# Patient Record
Sex: Female | Born: 1985 | Race: White | Hispanic: Yes | Marital: Married | Smoking: Never smoker
Health system: Southern US, Community
[De-identification: ages and names within clinical notes are randomized; demographics above are authoritative.]

## PROBLEM LIST (undated history)

## (undated) DIAGNOSIS — R7989 Other specified abnormal findings of blood chemistry: Secondary | ICD-10-CM

## (undated) DIAGNOSIS — I8393 Asymptomatic varicose veins of bilateral lower extremities: Secondary | ICD-10-CM

## (undated) DIAGNOSIS — F410 Panic disorder [episodic paroxysmal anxiety] without agoraphobia: Secondary | ICD-10-CM

## (undated) HISTORY — DX: Other specified abnormal findings of blood chemistry: R79.89

## (undated) HISTORY — DX: Panic disorder (episodic paroxysmal anxiety): F41.0

## (undated) HISTORY — DX: Asymptomatic varicose veins of bilateral lower extremities: I83.93

---
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
## 2004-06-01 ENCOUNTER — Inpatient Hospital Stay (HOSPITAL_COMMUNITY): Admission: AD | Admit: 2004-06-01 | Discharge: 2004-06-03 | Payer: Self-pay | Admitting: Obstetrics and Gynecology

## 2004-06-01 ENCOUNTER — Ambulatory Visit: Payer: Self-pay | Admitting: Obstetrics and Gynecology

## 2009-08-18 ENCOUNTER — Ambulatory Visit (HOSPITAL_COMMUNITY): Admission: RE | Admit: 2009-08-18 | Discharge: 2009-08-18 | Payer: Self-pay | Admitting: Obstetrics & Gynecology

## 2010-01-03 ENCOUNTER — Ambulatory Visit: Payer: Self-pay | Admitting: Obstetrics and Gynecology

## 2010-01-03 ENCOUNTER — Inpatient Hospital Stay (HOSPITAL_COMMUNITY): Admission: AD | Admit: 2010-01-03 | Discharge: 2010-01-05 | Payer: Self-pay | Admitting: Obstetrics & Gynecology

## 2010-09-25 LAB — RPR: RPR Ser Ql: NONREACTIVE

## 2010-09-25 LAB — CBC
HCT: 39.7 % (ref 36.0–46.0)
Hemoglobin: 13.6 g/dL (ref 12.0–15.0)
MCH: 32.7 pg (ref 26.0–34.0)
MCHC: 34.1 g/dL (ref 30.0–36.0)
MCV: 95.8 fL (ref 78.0–100.0)
Platelets: 195 10*3/uL (ref 150–400)
RBC: 4.14 MIL/uL (ref 3.87–5.11)
RDW: 14.9 % (ref 11.5–15.5)
WBC: 10.9 10*3/uL — ABNORMAL HIGH (ref 4.0–10.5)

## 2012-05-05 ENCOUNTER — Ambulatory Visit: Payer: Self-pay | Admitting: Internal Medicine

## 2012-05-05 VITALS — BP 118/66 | HR 93 | Temp 98.2°F | Resp 18 | Ht 63.0 in | Wt 132.2 lb

## 2012-05-05 DIAGNOSIS — F439 Reaction to severe stress, unspecified: Secondary | ICD-10-CM

## 2012-05-05 DIAGNOSIS — G43709 Chronic migraine without aura, not intractable, without status migrainosus: Secondary | ICD-10-CM

## 2012-05-05 DIAGNOSIS — Z609 Problem related to social environment, unspecified: Secondary | ICD-10-CM

## 2012-05-05 DIAGNOSIS — Z789 Other specified health status: Secondary | ICD-10-CM

## 2012-05-05 DIAGNOSIS — F430 Acute stress reaction: Secondary | ICD-10-CM

## 2012-05-05 DIAGNOSIS — G43909 Migraine, unspecified, not intractable, without status migrainosus: Secondary | ICD-10-CM

## 2012-05-05 MED ORDER — IBUPROFEN 600 MG PO TABS: 600.0000 mg | ORAL_TABLET | Freq: Three times a day (TID) | ORAL | Status: DC | PRN

## 2012-05-05 MED ORDER — ALPRAZOLAM 0.25 MG PO TABS: 0.2500 mg | ORAL_TABLET | Freq: Two times a day (BID) | ORAL | Status: DC | PRN

## 2012-05-05 NOTE — Patient Instructions (Addendum)
Kara Pacer por estrs (Stress Fracture) Cuando se aplica demasiada tensin sobre el pie, como en los deportes en los que se corre o se salta, el eje central de los huesos del pie es muy susceptible de sufrir una fractura (quebradura del [place]) debido a lo delgado del [place]. La lesin es ms comn cuando hay osteoporosis o si se utiliza un calzado inadecuado para correr. Deben utilizarse zapatos con un colchn de aire adecuado para que absorba lo golpes de la actividad que se Biomedical engineer. Las fracturas por estrs son muy comunes en las competiciones de atletismo en mujeres, quienes desarrollan pequeas roturas en la superficie de los huesos en sus pies y piernas. Las mujeres ms propensas a sufrir estas lesiones son aquellas que restringen ciertas comidas y las que tienen periodos irregulares. Las fracturas por estrs normalmente comienzan como una molestia menor en el pie o pierna. La fractura normalmente sucede cerca del final de una carrera larga. Por lo general el dolor desaparece con el reposo. Al da siguiente el dolor reaparece al comenzar la carrera. Si el deportista nota que el dolor aparece al tocar un punto del hueso y deja de correr por Elisabeth Most, podr volver a la carrera rpidamente. Pero normalmente el dolor se ignora y se desarrolla una fractura por estrs. El deportista deber evitar el impacto fuerte de correr, pero puede andar en bicicleta o nadar hasta que la fractura se cure luego de 6 a 12 semanas. Los lugares ms comunes para la fractura por estrs son los huesos de la regin anterior del pie y el hueso ms largo de la parte inferior de la pierna, pero la actividad de correr puede producir fracturas en cualquier parte, incluso en los huesos plvicos. DIAGNSTICO Generalmente el diagnstico se efecta en base a la historia clnica. El hueso implicado comienza a doler progresivamente como consecuencia de la [place]. Las radiografas pueden dar un resultado negativo (no Scientist, clinical (histocompatibility and immunogenetics) fracturas) en  las primeras dos o tres semanas luego del comienzo del Engineer, mining. Una radiografa posterior podr mostrar seales de curacin del hueso (formacin callosa). Una gammagrafa sea generalmente establecer el diagnstico. INSTRUCCIONES PARA EL CUIDADO DOMICILIARIO   El tratamiento podr incluir o no un yeso o una bota. Cuando es Programmer, applications un yeso, generalmente se [place] por un perodo breve para no hacer ms lenta la curacin por la prdida del msculo (atrofia).   Debe suspender las actividades hasta que el profesional que lo asiste se lo indique.   Use zapatos que permitan absorber los impactos.   Podr Charity fundraiser espera que el hueso se cure. Entre ellos se incluyen el ciclismo y la natacin, o aquellos que el profesional le aconseje.  Si no le han colocado un yeso o una tablilla:  Podr caminar con el pie lesionado segn lo tolere o como se lo hayan aconsejado.   No soporte ningn peso sobre el pie lesionado hasta que se le indique. Aumente lentamente la cantidad de tiempo que camina sobre el pie, hasta que el dolor se lo permita, o segn se lo hayan indicado.   Use muletas hasta que pueda soportar el peso sin dolor. Un aumento gradual del [address]   Aplique hielo sobre la lesin durante 15 a 20 minutos por hora mientras se encuentre despierto, durante los 2 primeros [place]. Ponga el hielo en una bolsa plstica y coloque una toalla entre la bolsa y la piel.   Utilice los medicamentos de venta libre o de prescripcin para Chief Technology Officer, Environmental health practitioner o  la fiebre, segn se lo indique el profesional que lo asiste.   Si el mdico le ha recomendado una evaluacin de seguimiento, es importante que concurra a la cita. Si no cumple con el seguimiento podr resultar en una lesin crnica o permanente, dolor e incapacidad. Si existe algn problema que le impide concurrir a la cita, Multimedia programmer a las instalaciones para obtener asistencia.  SOLICITE ATENCIN MDICA DE  INMEDIATO SI:  El dolor empeora en lugar de mejorar, o si el dolor no es controlable con los medicamentos.   Aumenta la hinchazn o el enrojecimiento en el pie.  Document Released: 06/08/2008 Document Revised: 06/15/2011 [facility] Patient Information 2012 [place], [place].Cefalea migraosa (Migraine Headache)  Caroleen Hamman es un dolor intenso y punzante en uno o ambos lados de la cabeza. Puede durar desde 30 minutos hasta varias horas.  CAUSAS  No siempre se conoce la causa exacta. Sin embargo, IT consultant [employer] nervios del cerebro se irritan y liberan ciertas sustancias qumicas que causan inflamacin. Esto ocasiona dolor.  SNTOMAS   Dolor en uno o ambos lados de la cabeza.  Dolor punzante o con pulsaciones.  Dolor que es lo suficientemente grave en intensidad como para impedir las actividades habituales.  Se agrava por cualquier actividad fsica habitual.  Nuseas, vmitos o ambos.  Mareos.  Dolor ante la exposicin a luces brillantes o a ruidos fuertes o con Agricultural engineer.  Sensibilidad general a las luces brillantes, a los ruidos fuertes o a los [employer]. Antes de sentir a Caroleen Hamman, puede recibir seales de advertencia de que est por aparecer (aura). Un aura puede incluir:   Visin de luces intermitentes.  Visin de puntos brillantes, halos o lneas en zigzag.  Visin en tnel o visin borrosa.  Sensacin de entumecimiento u hormigueo.  Tener dificultad para hablar.  Tener debilidad muscular. DISPARADORES DE LA CEFALEA MIGRAOSA  Beber alcohol.  El hbito de fumar.  El estrs.  Con la menstruacin.  Quesos estacionados.  Alimentos o bebidas que contienen nitratos, glutamato, aspartamo o tiramina.  La falta de sueo.  Chocolate.  Cafena.  Hambre.  Con una actividad fsica extenuante.  Fatiga.  Los medicamentos utilizados para tratar Aeronautical engineer (nitroglicerina), las pldoras anticonceptivas, los estrgenos y algunos medicamentos  para la hipertensin pueden provocarla. DIAGNSTICO Una cefalea migraosa a menudo se diagnostica segn:  Sntomas.  Examen fsico.  Neomia Dear TC (tomografa computada) o resonancia magntica de la cabeza. TRATAMIENTO Le prescribirn medicamentos para Engineer, materials y las nuseas. Tambin podrn administrarse medicamentos para ayudar a Armed forces training and education officer.  INSTRUCCIONES PARA EL CUIDADO EN EL HOGAR   Slo tome medicamentos de venta libre o recetados para Primary school teacher o Environmental health practitioner, segn las indicaciones de su mdico. No se recomienda usar analgsicos narcticos a Air cabin crew.  Acustese en un cuarto oscuro y tranquilo cuando tiene [place].  Lleve un registro diario para Financial risk analyst lo que puede provocar dolores de cabeza por la [place]. Por ejemplo, escriba:  Lo que come y bebe.  Cunto tiempo duerme.  Todo cambio en la dieta o medicamentos.  Limite el consumo de bebidas alcohlicas.  Si fuma, deje de hacerlo.  Duerma entre 7 y 9 horas o como lo indique su mdico.  Limite las situaciones de Librarian, academic.  Mantenga las luces tenues si le [employer] luces brillantes y la [place]. SOLICITE ATENCIN MDICA DE INMEDIATO SI:   La migraa se hace cada vez ms intensa.  Tiene fiebre.  Presenta rigidez en  el cuello.  Tiene prdida de visin.  Presenta debilidad muscular o prdida del control muscular.  Comienza a perder el equilibrio o tiene problemas para caminar.  Sufre mareos o se desmaya.  Tiene sntomas graves que son diferentes a los primeros sntomas. ASEGRESE QUE:   Comprende estas instrucciones.  Controlar su enfermedad.  Solicitar ayuda de inmediato si no mejora o empeora. Document Released: 06/26/2005 Document Revised: 09/18/2011 [facility] Patient Information 2013 [place], [place]. Control del estrs (Stress Management) El estrs es un estado de tensin fsico o mental que puede ser el resultado de cambios en su vida o en su rutina  habitual. Algunas causas frecuentes de estrs son:  Tessie Fass de los seres queridos.  Daos o enfermedades graves.  Ser despedido o cambiar de empleo.  O mudarse a una nueva casa. Causas pueden ser:  Problemas sexuales.  Prdidas econmicas o financieras.  Tener grandes deudas.  Conflictos con alguna persona en la casa o en el empleo.  O cansancio permanente por falta de sueo. No slo las cosas malas producen estrs. Puede ser estresante:  Ganar la lotera.  Casarse.  Comprar Un automvil nuevo. La cantidad de estrs que puede tolerarse fcilmente, vara de Neomia Dear persona a otra. Los cambios generalmente causan estrs, independientemente del tipo de [place]. Demasiado estrs puede [employer]. Puede conducir a problemas fsicos o emocionales. Demasiado poco estrs (aburrimiento) tambin puede llegar a ser Higher education careers adviser. SUGERENCIAS PARA REDUCIR EL ESTRS:  Converse acerca de las cosas que lo preocupan con su familia o sus amigos. Generalmente es bueno compartir las preocupaciones. Si siente que el problema es grave, podr necesitar ayuda de un profesional para que lo oriente.  Considere los problemas de a uno por vez, Teacher, English as a foreign language de tratarlos todos juntos. Tratar de ocuparse de todo al mismo tiempo es imposible. Haga una lista de todas las cosas que necesita hacer y luego comience por la ms importante. Propngase el objetivo de hacer 2  3 cosas por da. Si se propone hacer demasiadas cosas en un da, por supuesto fracasar, y esto le causar an ms estrs.  No consuma drogas ni alcohol para Art gallery manager. Aunque lo hagan sentir mejor por un breve tiempo, no resuelven los [employer] causa el estrs. Tambin pueden causarle adiccin.  Practique ejercicios con regularidad, al menos tres veces por semana. El ejercicio fsico puede ayudarlo a Paramedic la sensacin de "nerviosismo" y lo Librarian, academic.  La distancia ms corta entre la desesperacin y la esperanza a menudo es una  buena noche de sueo.  Vaya a dormir y levntese con el tiempo suficiente para cumplir con sus obligaciones sin estar apurado.  Tmese un breve perodo de descanso de las situaciones estresantes que se producen Administrator. Cierre los ojos y respire profundo. Comience con los msculos del rostro, tnselos, mantenga durante algunos segundos y luego reljese. Reptalo con los msculos del cuello, hombros, [place], [place], espalda y piernas.  Cudese adecuadamente. Consuma una dieta balanceada y descanse lo suficiente.  Programe un tiempo para divertirse. Tmese un descanso en la rutina diaria para relajarse. INSTRUCCIONES PARA EL CUIDADO DOMICILIARIO  Pida ayuda si se siente abrumado por sus problemas y siente que no puede controlarlos solo.  Regrese inmediatamente si siente que se est lastimando a usted mismo o a Engineer, maintenance (IT). Document Released: 04/05/2005 Document Revised: 09/18/2011 [facility] Patient Information 2013 [place], [place].

## 2012-05-05 NOTE — Progress Notes (Signed)
  Subjective:    Patient ID: Alyssa Singh, female    DOB: Nov 20, 1985, 26 y.o.   MRN: [id number]  HPI Has hx of stress, anger, and has. Further hx reveals ha with aura Using interpretor with success  Review of Systems     Objective:   Physical Exam  Vitals reviewed. Constitutional: She is oriented to person, place, and time. She appears well-developed and well-nourished. No distress.  HENT:  Right Ear: External ear normal.  Left Ear: External ear normal.  Mouth/Throat: Oropharynx is clear and moist.  Eyes: EOM are normal. Pupils are equal, round, and reactive to light.  Neck: Normal range of motion. Neck supple. No thyromegaly present.  Cardiovascular: Normal rate, regular rhythm and normal heart sounds.   Pulmonary/Chest: Effort normal and breath sounds normal.  Abdominal: Soft. She exhibits no mass. There is no tenderness.  Musculoskeletal: Normal range of motion.  Lymphadenopathy:    She has no cervical adenopathy.  Neurological: She is alert and oriented to person, place, and time. No cranial nerve deficit. She exhibits normal muscle tone. Coordination normal.  Skin: Skin is warm and dry.  Psychiatric: She has a normal mood and affect.   No drift and great balance       Assessment & Plan:  Alprazolam/motrin 600mg  2 at startigrain

## 2013-07-10 DIAGNOSIS — R7989 Other specified abnormal findings of blood chemistry: Secondary | ICD-10-CM | POA: Insufficient documentation

## 2013-07-10 HISTORY — DX: Other specified abnormal findings of blood chemistry: R79.89

## 2016-08-31 ENCOUNTER — Ambulatory Visit (INDEPENDENT_AMBULATORY_CARE_PROVIDER_SITE_OTHER): Payer: Self-pay | Admitting: Urgent Care

## 2016-08-31 ENCOUNTER — Encounter: Payer: Self-pay | Admitting: Urgent Care

## 2016-08-31 VITALS — BP 105/64 | HR 83 | Temp 99.5°F | Resp 18 | Ht 63.0 in | Wt 136.0 lb

## 2016-08-31 DIAGNOSIS — F411 Generalized anxiety disorder: Secondary | ICD-10-CM

## 2016-08-31 DIAGNOSIS — F439 Reaction to severe stress, unspecified: Secondary | ICD-10-CM

## 2016-08-31 MED ORDER — ALPRAZOLAM 0.25 MG PO TABS: 0.2500 mg | ORAL_TABLET | Freq: Every evening | ORAL | 0 refills | Status: DC | PRN

## 2016-08-31 MED ORDER — FLUOXETINE HCL 20 MG PO TABS: 20.0000 mg | ORAL_TABLET | Freq: Every day | ORAL | 1 refills | Status: DC

## 2016-08-31 NOTE — Patient Instructions (Addendum)
Crisis de [place] ([place]ic Attacks) Las crisis de [place] son ataques repentinos y [place], miedo o Dentist extremos. Es posible que ocurran sin motivo, cuando est relajado, ansioso o cuando duerme. Las crisis de [place] pueden ocurrir por algunas de estas razones:  En ocasiones, las personas sanas presentan crisis de [place] en situaciones extremas, potencialmente mortales, como la guerra o los desastres naturales. La ansiedad normal es un mecanismo de defensa del cuerpo que nos ayuda a Publishing rights manager ante situaciones de peligro (respuesta de defensa o huida).  Con frecuencia, las crisis de [place] aparecen acompaadas de trastornos de ansiedad, como trastorno de pnico, trastorno de ansiedad social, trastorno de ansiedad generalizada y fobias. Los trastornos de ansiedad provocan ansiedad excesiva o incontrolable. Sus relaciones y [address] pueden verse Education officer, environmental.  En ocasiones, las crisis de ansiedad se presentan con otras enfermedades mentales, como la depresin y el trastorno por estrs postraumtico.  Algunas enfermedades, medicamentos recetados y drogas pueden provocar crisis de [place]. SNTOMAS Las crisis de [place] comienzan repentinamente, Writer punto mximo a los 20 minutos y se presentan junto con cuatro o ms de los siguientes sntomas:  Latidos cardacos acelerados o frecuencia cardaca elevada (palpitaciones).  Sudoracin.  Temblores o sacudidas.  Dificultad para respirar o sensacin de asfixia.  Sensacin de [employer].  Dolor o International aid/development worker.  Nuseas o sensacin extraa en el estmago.  Mareos, sensacin de desvanecimiento o de desmayo.  Escalofros o sofocos.  Hormigueos o adormecimiento en los labios o las manos y los pies.  Sensacin de [employer] no son reales o de que no es usted mismo.  Temor a perder el control o el juicio.  Temor a Musician. Algunos de estos sntomas pueden parecerse a enfermedades graves. Por ejemplo, es  posible que piense que tendr un ataque cardaco. Aunque las crisis de [place] pueden ser muy atemorizantes, no son potencialmente mortales. DIAGNSTICO Las crisis de [place] se diagnostican con una evaluacin que realiza el mdico. Su mdico le realizar preguntas sobre los sntomas, como cundo y dnde ocurrieron. Tambin le preguntar sobre su historia clnica y [place] consumo de alcohol y drogas, incluidos los medicamentos recetados. Es posible que su mdico le indique anlisis de sangre u otros estudios para Museum/gallery exhibitions officer graves. El mdico podr derivarlo a un profesional de la salud mental para que le realice una evaluacin ms profunda. TRATAMIENTO  En general, las personas sanas que registran una o [place] crisis de [place] bajo una situacin extrema, potencialmente mortal, no requerirn TEFL teacher.  El [place] de las crisis de [place] asociadas con trastornos de ansiedad u otras enfermedades mentales, generalmente, requiere orientacin por parte de un profesional de la salud mental medicamentos, o bien la combinacin de [place]. Su mdico le ayudar a Leisure centre manager tratamiento para usted.  Las crisis de [place] asociadas a enfermedades fsicas, generalmente, desaparecen con el tratamiento de la enfermedad. Si un medicamento recetado le causa crisis de [place], consulte a su mdico si debe suspenderlo, disminuir la dosis o sustituirlo por otro medicamento.  Las crisis de [place] asociadas al consumo de drogas o alcohol desaparecen con la abstinencia. Algunos adultos necesitan ayuda profesional para dejar de beber o de consumir drogas. INSTRUCCIONES PARA EL CUIDADO EN EL HOGAR  Tome todos los medicamentos como le indic el mdico.  Planifique y concurra a todas las visitas de control, segn le indique el mdico. Es importante que concurra a todas las visitas. SOLICITE ATENCIN MDICA SI:  No puede tomar los [employer] se  lo han indicado.  Los sntomas no  mejoran o empeoran. SOLICITE ATENCIN MDICA DE INMEDIATO SI:  Experimenta sntomas de crisis de [place] diferentes de los que presenta habitualmente.  Tiene pensamientos serios acerca de lastimarse a usted mismo o daar a Economist.  Toma medicamentos para las crisis de [place] y presenta efectos secundarios graves. ASEGRESE DE QUE:  Comprende estas instrucciones.  Controlar su afeccin.  Recibir ayuda de inmediato si no mejora o si empeora. Esta informacin no tiene Theme park manager el consejo del mdico. Asegrese de hacerle al mdico cualquier pregunta que tenga. Document Released: 06/26/2005 Document Revised: 07/01/2013 Document Reviewed: 02/07/2013 Elsevier Interactive Patient Education  2017 Elsevier Inc.    Fluoxetine capsules or tablets (Depression/Mood Disorders) Qu es este medicamento? La FLUOXETINA pertenece a un grupo de medicamentos llamados inhibidores selectivos de la recaptacin de serotonina (ISRS). Ayuda a tratar problemas de estado de nimo, tales como depresin, trastorno obsesivo-compulsivo y trastorno de pnico. Tambin puede tratar ciertos trastornos de la alimentacin. Este medicamento puede ser utilizado para otros usos; si tiene alguna pregunta consulte con su proveedor de atencin mdica o con su farmacutico. MARCAS COMUNES: Prozac Qu le debo informar a mi profesional de la salud antes de tomar este medicamento? Necesitan saber si usted presenta alguno de los [employer] o situaciones: trastorno bipolar o antecedentes familiares de trastorno bipolar trastornos de sangrado glaucoma enfermedad cardiaca enfermedad heptica bajos niveles de sodio en la sangre convulsiones ideas, planes o intento de suicidio; si usted o alguien de su familia ha intentado suicidarse previamente si toma IMAO, tales como Carbex, Eldepryl, Marplan, Nardil y Parnate si toma medicamentos que tratan o previenen cogulos sanguneos enfermedad tiroidea una  reaccin alrgica o inusual a la fluoxetina, a otros medicamentos, alimentos, colorantes o conservantes si est embarazada o buscando quedar embarazada si est amamantando a un beb Cmo debo utilizar este medicamento? Tome este medicamento por va oral con un vaso de agua. Siga las instrucciones de la etiqueta del [place]. Puede tomar este medicamento con o sin alimentos. Tome sus dosis a intervalos regulares. No tome su medicamento con una frecuencia mayor a la indicada. No deje de tomar PPL Corporation repentinamente a menos que as indique su mdico. El detener este medicamento demasiado rpido puede causar efectos secundarios graves o puede empeorar su condicin. Su farmacutico le dar una [place] del medicamento especial con cada receta y relleno. Asegrese de leer esta informacin cada vez cuidadosamente. Hable con su pediatra para informarse acerca del uso de este medicamento en nios. Aunque este medicamento puede ser recetado a nios tan menores como de 7 aos de edad para condiciones selectivas, las precauciones se aplican. Sobredosis: Pngase en contacto inmediatamente con un centro toxicolgico o una sala de urgencia si usted cree que haya tomado demasiado medicamento. ATENCIN: [employer] es solo para usted. No comparta este medicamento con nadie. Qu sucede si me olvido de una dosis? Si olvida una dosis, sltese la dosis [place] y vuelva al horario habitual de sus dosis. No tome dosis adicionales o dobles. Qu puede interactuar con este medicamento? No tome este medicamento con ninguno de los siguientes medicamentos: -otros medicamentos que contienen fluoxetina, tales como [place] o Symbyax -cisapride -linezolid -IMAOs, tales como Carbex, Eldepryl, Marplan, Nardil y Parnate -azul de metileno (Paramedic) -pimozida -tioridazina Este medicamentotambin puede interactuar con los siguientes medicamentos: -alcohol -aspirina o medicamentos tipo  aspirina -carbamazepina -ciertos medicamentos para la depresin, ansiedad o trastornos psicticos -ciertos medicamentos para migraas, tales como almotriptn, eletriptn, frovatriptn, naratriptn, rizatriptn,  sumatriptn, zolmitriptn -digoxina -diurticos -fentanilo -flecainida -furazolidona -isoniazida -litio -medicamentos para conciliar el sueo -medicamentos que tratan o previenen cogulos sanguneos, tales como warfarina, enoxaparina y dalteparina -AINE, medicamentos para el dolor o la inflamacin, tales como el ibuprofeno o naproxeno -fenitona -procarbazina -propafenona -rasagilina -ritonavir -suplementos como hierba de [place], kava kava, valeriana -tramadol -triptfano -vinblastina Puede ser que esta lista no menciona todas las posibles interacciones. Informe a su profesional de Beazer Homesla salud de Ingram Micro Inctodos los productos a base de hierbas, medicamentos de [place] libre o suplementos nutritivos que est tomando. Si usted fuma, consume bebidas alcohlicas o si utiliza drogas ilegales, indqueselo tambin a su profesional de Beazer Homesla salud. Algunas sustancias pueden interactuar con su medicamento. A qu debo estar atento al usar PPL Corporationeste medicamento? Informe a su mdico si sus sntomas no mejoran o si empeoran. Visite a su mdico o a su profesional de la salud para chequear su evolucin peridicamente. Debido que puede ser necesario tomar este medicamento durante varias semanas para que sea posible observar sus efectos en forma [place], es importante que sigue su tratamiento como recetado por su mdico. Los pacientes y sus familias deben estar atentos si empeora la depresin o ideas suicidas. Tambin est atento a cambios repentinos o severos de emocin, tales como el sentirse ansioso, agitado, lleno de pnico, irritable, hostil, agresivo, impulsivo, inquietud severa, demasiado excitado y hiperactivo o dificultad para conciliar el sueo. Si esto ocurre, especialmente al comenzar con el tratamiento o al  cambiar de dosis, comunquese con su mdico. Puede experimentar somnolencia o Golden West Financialmareos. No conduzca ni utilice maquinaria, ni haga nada que Scientist, research (life sciences)le exija permanecer en estado de alerta hasta que sepa cmo le afecta este medicamento. No se siente ni se ponga de pie con rapidez, especialmente si es un paciente de edad avanzada. Esto reduce el riesgo de mareos o Newell Rubbermaiddesmayos. El alcohol puede interferir con el efecto de [place] medicamento. Evite consumir bebidas alcohlicas. Se le podr secar la boca. Masticar chicle sin azcar, chupar caramelos duros y tomar agua en abundancia le ayudar a mantener la boca hmeda. Si el problema no desaparece o es severo, consulte a su mdico. Este medicamento puede afectar sus niveles de Bankerazcar en la sangre. Si tiene diabetes, consulte con su mdico o profesional de la salud antes de cambiar su dieta o la dosis de su medicamento para la diabetes. Qu efectos secundarios puedo tener al Boston Scientificutilizar este medicamento? Efectos secundarios que debe informar a su mdico o a Producer, television/film/videosu profesional de la salud tan pronto como sea posible: Therapist, artreacciones alrgicas, como erupcin cutnea, comezn/picazn o urticarias, e hinchazn de la cara, los labios o la lengua heces de color negro y aspecto alquitranado problemas respiratorios cambios en la visin confusin estado de nimo elevado, menor necesidad de dormir, pensamientos acelerados, conducta impulsiva dolor ocular ritmo cardiaco rpido, irregular sensacin de desmayos o aturdimiento, cadas alucinaciones, prdida del contacto con la realidad prdida de equilibrio o coordinacin prdida de memoria inquietud, caminar de un lado a otro, incapacidad para quedarse quieto convulsiones rigidez de los Exelon Corporationmsculos ideas suicidas u otros cambios en el estado de nimo hinchazn o enrojecimiento en o alrededor del ojo dificultad para conciliar el sueo sangrado o moretones inusuales Efectos secundarios que generalmente no requieren Psychologist, prison and probation servicesatencin mdica (infrmelos a su mdico o a  Producer, television/film/videosu profesional de la salud si persisten o si son molestos): cambios en el deseo o desempeo sexual diarrea boca seca dolor de cabeza aumento de la sudoracin prdida del apetito nuseas Puede ser que esta lista no ONEOKmenciona todos los  posibles efectos secundarios. Comunquese a su mdico por asesoramiento mdico Hewlett[employer]ard. Usted puede informar los efectos secundarios a la FDA por telfono al 1-800-FDA-1088. Dnde debo guardar mi medicina? Mantngala fuera del alcance de los nios. Gurdela a San[employer]SCI, entre 15 y 30 grados C (57 y 32 grados F). Deseche todo el medicamento que no haya utilizado, despus de la fecha de vencimiento. ATENCIN: Este folleto es un resumen. Puede ser que no cubra toda la posible informacin. Si usted tiene preguntas acerca de esta medicina, consulte con su mdico, su farmacutico o su profesional de Radiographer, therapeutic.  2017 Elsevier/Gold Standard (2015-11-15 00:00:00)    Alprazolam tablets Qu es este medicamento? El ALPRAZOLAM es una benzodiacepina. Se utiliza para tratar la ansiedad y los ataques de pnico. [place] medicamento puede ser utilizado para otros usos; si tiene alguna pregunta consulte con su proveedor de atencin mdica o con su farmacutico. MARCAS COMUNES: Xanax Qu le debo informar a mi profesional de la salud antes de tomar este medicamento? Necesita saber si usted presenta alguno de los siguientes problemas o situaciones: -problema de alcoholismo o drogadiccin -trastorno bipolar, depresin, psicosis u otros problemas de salud mental -glaucoma -enfermedad renal o heptica -enfermedad pulmonar o respiratoria -miastenia gravis -enfermedad de Parkinson -porfiria -convulsiones o antecedentes de convulsiones -ideas suicidas -una reaccin alrgica o inusual al alprazolam, a otras benzodiacepinas, alimentos, colorantes o conservantes -si est embarazada o buscando quedar embarazada -si est amamantando a un beb Cmo debo  utilizar este medicamento? Tome este medicamento por va oral con un vaso de agua. Siga las instrucciones de la etiqueta del [place]. Tome sus dosis a intervalos regulares. No tome su medicamento con una frecuencia mayor a la indicada. Si ha venido tomando PPL Corporation de [place] regular durante algn tiempo, no deje de tomarlo repentinamente. Debe reducir gradualmente la dosis para no sufrir efectos secundarios severos. Consulte a su mdico o a su profesional de la salud por asesoramiento. Aun despus de dejar de tomarlo, los efectos del medicamento en su cuerpo pueden perdurar Care[employer] Rx. Hable con su pediatra para informarse acerca del uso de este medicamento en nios. Puede requerir atencin especial. Sobredosis: Pngase en contacto inmediatamente con un centro toxicolgico o una sala de urgencia si usted cree que haya tomado demasiado medicamento. ATENCIN: [employer] es solo para usted. No comparta este medicamento con nadie. Qu sucede si me olvido de una dosis? Si olvida una dosis, tmela lo antes posible. Si es casi la hora de la prxima dosis, tome slo esa dosis. No tome dosis adicionales o dobles. Qu puede interactuar con este medicamento? No tome esta medicina junto con ninguno de los siguientes medicamentos: -algunos medicamentos para la infeccin por VIH o SIDA -quetoconazol -itraconazol Esta medicina tambin puede interactuar con los siguientes medicamentos: -pldoras anticonceptivas -algunos antibiticos macrlidos, tales como claritromicina, eritromicina o troleandomicina -cimetidina -ciclosporina -ergotamina -jugo de toronja -suplementos dietticos o a base de hierbas, como kava kava, melatonina, dehidroepiandrosterona, DHEA, hierba de [place] o valeriana -imatinib, STI-571 -isoniazida -levodopa -medicamentos para la depresin, ansiedad o trastornos psicticos -analgsicos recetados -rifampicina, rifapentina o rifabutina -algunos medicamentos  para la presin sangunea o problemas cardiacos -algunos medicamentos para las convulsiones, tales como [place], [place], [place], Marine scientist o primidona Puede ser que esta lista no menciona todas las posibles interacciones. Informe a su profesional de Beazer Homes de In[employer] Inc productos a base de hierbas, medicamentos de [place] o suplementos nutritivos que est tomando. Si usted fuma, consume bebidas alcohlicas o si [place] drogas  ilegales, indqueselo tambin a su profesional de [employer]. Algunas sustancias pueden interactuar con su medicamento. A qu debo estar atento al usar PPL Corporation? Visite a su mdico o a su profesional de la salud para chequear su evolucin peridicamente. Su cuerpo puede hacerse dependiente del medicamento. Por esta razn, pregunte a su mdico o a su profesional de la salud si todava necesita tomarlo. Puede experimentar somnolencia o mareos. No conduzca ni utilice maquinaria, ni haga nada que Scientist, research (life sciences) en estado de alerta hasta que sepa cmo le afecta este medicamento. Para reducir el riesgo de mareos o [place], no se ponga de pie ni se siente con rapidez, especialmente si es un paciente de edad avanzada. El alcohol puede aumentar su somnolencia y [place]. Evite consumir bebidas alcohlicas. No se trate usted mismo si tiene tos, resfro o Environmental consultant sin Science writer a su mdico o a su profesional de Radiographer, therapeutic. Algunos ingredientes pueden aumentar los posibles efectos secundarios. Qu efectos secundarios puedo tener al [employer] este medicamento? Efectos secundarios que debe informar a su mdico o a Producer, television/film/video de la salud tan pronto como sea posible: -Therapist, art como erupcin cutnea, picazn o urticarias, hinchazn de la cara, labios o lengua -confusin, tendencia a olvidar -depresin -dificultad para conciliar el sueo -dificultad para hablar -sensacin de desmayos o mareos, cadas -cambios de humor, excitabilidad o comportamiento  agresivo -calambres musculares -dificultad para orinar o cambios en el volumen de orina -cansancio o debilidad inusual Efectos secundarios que, por lo general, no requieren atencin mdica (debe informarlos a su mdico o a su profesional de la salud si persisten o si son molestos): -cambios en el deseo sexual o capacidad -cambios del apetito Puede ser que esta lista no menciona todos los posibles efectos secundarios. Comunquese a su mdico por asesoramiento mdico [employer]lett[employer]ard. Usted puede informar los efectos secundarios a la FDA por telfono al 1-800-FDA-1088. Dnde debo guardar mi medicina? Mantngala fuera del alcance de los nios. Este medicamento puede ser abusado. Mantenga su medicamento en un lugar seguro para protegerlo contra robos. No comparta este medicamento con nadie. Es peligroso vender o Restaurant manager, fast food y est prohibido por la ley. Gurdelo a San[employer]-SCI, entre 20 y 25 grados C (49 y 60 grados F). Este medicamento puede causar sobredosis accidental y muerte si otros adultos, nios o Neurosurgeon se lo toman. Mezcle cualquier medicamento sin usar con [employer] arena de [place] o granos de caf. Luego Teacher, music en un contenedor cerrado como una bolsa cerrada o una lata de caf con una tapa. No utilice el medicamento despus de la fecha de vencimiento. ATENCIN: Este folleto es un resumen. Puede ser que no cubra toda la posible informacin. Si usted tiene preguntas acerca de esta medicina, consulte con su mdico, su farmacutico o su profesional de Radiographer, therapeutic.  2017 Elsevier/Gold Standard (2014-08-18 00:00:00)      IF you received an x-ray today, you will receive an invoice from [facility]. Please contact [facility] at [phone] with questions or concerns regarding your invoice.   IF you received labwork today, you will receive an invoice from [place]. Please contact [facility] at [phone] with questions or  concerns regarding your invoice.   Our billing staff will not be able to assist you with questions regarding bills from these companies.  You will be contacted with the lab results as soon as they are available. The fastest way to get your results is to activate your My Chart account. Instructions are  located on the last page of this paperwork. If you have not heard from [place] regarding the results in 2 weeks, please contact this office.

## 2016-08-31 NOTE — Progress Notes (Signed)
    MRN: [id number] DOB: 04/05/1986  Subjective:   Alyssa Singh is a 31 y.o. female new to our practice presenting for chief complaint of Anxiety  Reports being under a lot of stress. She had a really bad argument with her son this past week, had to take him to the ED as a result. Her husband was also deported in 04/2016. She has 3 children she is now supporting on her own and is having a difficult time with this. She works in Environmental managermaintenance and cleaning, has a very demanding and stressful job. She admits having panic attacks this week since her argument with her son. Has had episodes of head pressure, transient sharp chest pain, shob, shakiness, sweating. Has also had crying spells and times of feeling overwhelmed. Denies SI, HI. Reports that she feels she has a good support network with her family and friends and overall has good relationships with her children. Denies smoking cigarettes or drinking alcohol.    Alyssa Singh is not currently taking any medications. Also has No Known Allergies. Alyssa Singh denies past medical and surgical history. Denies family history of cancer, diabetes, HTN, HL, heart disease, stroke, mental illness.   Objective:   Vitals: BP 105/64 (BP Location: Right Arm, Patient Position: Sitting, Cuff Size: Small)   Pulse 83   Temp 99.5 F (37.5 C) (Oral)   Resp 18   Ht 5\' 3"  (1.6 m)   Wt 136 lb (61.7 kg)   LMP 08/19/2016   SpO2 97%   BMI 24.09 kg/m   Physical Exam  Constitutional: She is oriented to person, place, and time. She appears well-developed and well-nourished.  HENT:  Mouth/Throat: Oropharynx is clear and moist.  Eyes: Pupils are equal, round, and reactive to light. No scleral icterus.  Neck: Normal range of motion. Neck supple. No thyromegaly present.  Cardiovascular: Normal rate, regular rhythm and intact distal pulses.  Exam reveals no gallop and no friction rub.   No murmur heard. Pulmonary/Chest: No respiratory distress. She has no wheezes. She has  no rales.  Neurological: She is alert and oriented to person, place, and time.  Skin: Skin is warm and dry.  Psychiatric: Her mood appears anxious. Her affect is not labile. Her speech is not rapid and/or pressured and not tangential. She exhibits a depressed mood (tearful throughout visit). She expresses no homicidal and no suicidal ideation.   Assessment and Plan :   1. Anxiety state 2. Stress - Will hold off on labs today. Patient is agreeable to starting Prozac for management of anxiety. She will use Xanax as needed for panic attacks. I counseled her on usage of this medication as a short term solution while Prozac starts to work for her anxiety. I recommended she look for a therapist or become involved with her church for counseling. She verbalized understanding of potential for adverse effects with medications. Follow up in 6 months or sooner if worsening symptoms.  Wallis BambergMario Ashlyne Olenick, PA-C [facility] [phone] 08/31/2016  11:10 AM

## 2016-09-04 ENCOUNTER — Telehealth: Payer: Self-pay

## 2016-09-04 NOTE — Telephone Encounter (Signed)
effexor tabs to expensive ok to switch to caps? Please send new rx to pharmacy

## 2016-09-06 ENCOUNTER — Telehealth: Payer: Self-pay

## 2016-09-06 MED ORDER — FLUOXETINE HCL 20 MG PO CAPS: 20.0000 mg | ORAL_CAPSULE | Freq: Every day | ORAL | 3 refills | Status: DC

## 2016-09-06 NOTE — Telephone Encounter (Signed)
[facility]  She could not explain why since she is spanish speaking and knows little english.  Please have Urban GibsonMani give the patient a call  [phone]

## 2016-09-06 NOTE — Telephone Encounter (Signed)
Yes, please switch to capsules instead. Let patient know.

## 2016-09-07 DIAGNOSIS — F410 Panic disorder [episodic paroxysmal anxiety] without agoraphobia: Secondary | ICD-10-CM

## 2016-09-07 HISTORY — DX: Panic disorder (episodic paroxysmal anxiety): F41.0

## 2016-09-07 NOTE — Telephone Encounter (Signed)
Pharmacy advised  

## 2016-09-07 NOTE — Telephone Encounter (Signed)
Reports ongoing anxiety, worrying, difficulty sleeping. She has used Xanax intermittently but has not started Prozac. Xanax has helped but she would like something for sleep that she could use more consistently. I recommended she start using benadryl 50mg  nightly. She is to cancel the appointment she set up for this weekend and schedule follow up in 6 weeks. She will pick up script for Prozac today and get started with this daily.

## 2016-09-09 ENCOUNTER — Ambulatory Visit: Payer: Self-pay | Admitting: Family Medicine

## 2016-11-29 ENCOUNTER — Ambulatory Visit (INDEPENDENT_AMBULATORY_CARE_PROVIDER_SITE_OTHER): Payer: Self-pay | Admitting: Internal Medicine

## 2016-11-29 ENCOUNTER — Encounter: Payer: Self-pay | Admitting: Internal Medicine

## 2016-11-29 VITALS — BP 100/70 | HR 78 | Resp 12 | Ht 62.0 in | Wt 140.0 lb

## 2016-11-29 DIAGNOSIS — Z23 Encounter for immunization: Secondary | ICD-10-CM

## 2016-11-29 DIAGNOSIS — K047 Periapical abscess without sinus: Secondary | ICD-10-CM

## 2016-11-29 MED ORDER — PENICILLIN V POTASSIUM 250 MG PO TABS: 250.0000 mg | ORAL_TABLET | Freq: Four times a day (QID) | ORAL | 0 refills | Status: DC

## 2016-11-29 MED ORDER — IBUPROFEN 200 MG PO TABS: ORAL_TABLET | ORAL | 0 refills | Status: DC

## 2016-11-29 NOTE — Progress Notes (Signed)
   Subjective:    Patient ID: Alyssa Singh, female    DOB: 06/30/1986, 31 y.o.   MRN: [id number]  HPI  Here to establish  1.  Left lower jaw tooth pain:  Pain started 4 days ago.  Swelling of lower jaw started 2 days ago.  States may be a bit better yesterday regarding pain and swelling.  Has been applying ice packs and using Listerine swish and spit.  No fever.  No outpatient prescriptions have been marked as taking for the 11/29/16 encounter (Office Visit) with Julieanne MansonMulberry, Maragret Vanacker, MD.    No Known Allergies   No past medical history on file.  No past surgical history on file.  Social History   Social History  . Marital status: Married    Spouse name: Alyssa Singh  . Number of children: 3  . Years of education: N/A   Occupational History  . House cleaning.    Social History Main Topics  . Smoking status: Never Smoker  . Smokeless tobacco: Never Used  . Alcohol use No  . Drug use: No  . Sexual activity: Not on file     Comment: Husband, Alyssa Singh was deported to [place] 7 months ago.     Other Topics Concern  . Not on file   Social History Narrative   Originally from [place].   [place]ame to [place] In [place].   Lives at home with 3 children, ages 2915, 9612, 316 yo.       Review of Systems     Objective:   Physical Exam  NAD HEENT:  PERRL, EOMI, TMs pearly gray, throat without injection.  Left lower posterior molar partially erupted with overlying gingiva.  Swelling without fluctuance seems to be associated with this tooth.  Obvious mild swelling of buccal mucosa externally as well overlying left lower jaw. Neck:  Supple, no adenopathy, though states mildly tender. Lungs:  CTA CV:  RRR without murmur or rub, radial pulses normal and equal        Assessment & Plan:  1.  Abscessed Tooth:  Pen V K 250 mg 4 times daily for 7 days.  Ibuprofen for pain. Dental referral.  2.  Single parent with husband in [place]:  Voices no needs.  Has home for which she is  paying rent and EBT for kids.  Discussed orange card and tokens at [employer].

## 2016-12-28 ENCOUNTER — Ambulatory Visit (INDEPENDENT_AMBULATORY_CARE_PROVIDER_SITE_OTHER): Payer: Self-pay | Admitting: Internal Medicine

## 2016-12-28 ENCOUNTER — Encounter: Payer: Self-pay | Admitting: Internal Medicine

## 2016-12-28 VITALS — BP 122/70 | HR 70 | Resp 12 | Ht 62.0 in | Wt 135.0 lb

## 2016-12-28 DIAGNOSIS — R7989 Other specified abnormal findings of blood chemistry: Secondary | ICD-10-CM

## 2016-12-28 DIAGNOSIS — Z0000 Encounter for general adult medical examination without abnormal findings: Secondary | ICD-10-CM

## 2016-12-28 DIAGNOSIS — I8393 Asymptomatic varicose veins of bilateral lower extremities: Secondary | ICD-10-CM

## 2016-12-28 DIAGNOSIS — E559 Vitamin D deficiency, unspecified: Secondary | ICD-10-CM

## 2016-12-28 HISTORY — DX: Asymptomatic varicose veins of bilateral lower extremities: I83.93

## 2016-12-28 NOTE — Progress Notes (Signed)
Subjective:    Patient ID: Alyssa Singh, female    DOB: 09-03-1985, 31 y.o.   MRN: [id number]  HPI   CPE without pap  1.  Pap:  [facility]  Always normal.  No family history  2.  Mammogram: Never.  No family history.  3.  Osteoprevention:  2 servings of dairy daily.  History of low Vitamin D in past 2-3 years ago.    4.  Guaiac Cards: Never.  No family history  5.  Colonoscopy:  Never.  No family history  6.  Immunizations: Immunization History  Administered Date(s) Administered  . Td 08/26/2009  . Tdap 11/29/2016    7.  Glucose/Cholesterol:  Has never been told she has high blood glucose or cholesterol.  No outpatient prescriptions have been marked as taking for the 12/28/16 encounter (Office Visit) with Julieanne MansonMulberry, Elyna Pangilinan, MD.    No Known Allergies   Past Medical History:  Diagnosis Date  . Low vitamin D level 2015  . Panic attacks 09/2016   related to son having "shock"      History reviewed. No pertinent surgical history.   Family History  Problem Relation Age of Onset  . Appendicitis Mother   . Arthritis Sister   . Anemia Sister     Social History   Social History  . Marital status: Married    Spouse name: Alyssa Singh  . Number of children: 3  . Years of education: 9   Occupational History  . House cleaning.    Social History Main Topics  . Smoking status: Never Smoker  . Smokeless tobacco: Never Used  . Alcohol use No  . Drug use: No  . Sexual activity: Not Currently    Birth control/ protection: Condom     Comment: Husband, Alyssa Singh was deported to [place] 7 months ago.     Other Topics Concern  . Not on file   Social History Narrative   Originally from [place].   [place]ame to [place] In [place].   Lives at home with 3 children, ages 615, 6812, 666 yo.     Review of Systems  Constitutional: Negative for appetite change, fatigue, fever and unexpected weight change.  HENT: Positive for dental problem (missed dental  appt., but doing better and was rescheduled for Augustt). Negative for ear pain, hearing loss, nosebleeds, rhinorrhea and sore throat.   Eyes: Negative for visual disturbance.  Respiratory: Negative for cough and shortness of breath.   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  Gastrointestinal: Negative for abdominal pain, blood in stool (no melena), constipation, diarrhea and nausea.  Genitourinary: Negative for dysuria, menstrual problem, pelvic pain and vaginal discharge.  Musculoskeletal: Negative for arthralgias.  Skin: Negative for rash.  Neurological: Negative for weakness, numbness and headaches.  Hematological: Negative for adenopathy. Does not bruise/bleed easily.  Psychiatric/Behavioral: Negative for dysphoric mood. The patient is not nervous/anxious.        Objective:   Physical Exam  Constitutional: She is oriented to person, place, and time. She appears well-developed and well-nourished.  HENT:  Head: Normocephalic and atraumatic.  Right Ear: Hearing, tympanic membrane, external ear and ear canal normal.  Left Ear: Hearing, tympanic membrane, external ear and ear canal normal.  Nose: Nose normal.  Mouth/Throat: Uvula is midline, oropharynx is clear and moist and mucous membranes are normal.    Eyes: Conjunctivae and EOM are normal. Pupils are equal, round, and reactive  to light.  Discs sharp bilaterally  Neck: Normal range of motion and full passive range of motion without pain. Neck supple. No thyroid mass and no thyromegaly present.  Cardiovascular: Normal rate, regular rhythm, S1 normal and S2 normal.  Exam reveals no S3, no S4 and no friction rub.   No murmur heard. No carotid bruits.  Carotid, radial, femoral, DP and PT pulses normal and equal.  Significant varicosities of bilateral LE, R >>L with superficial broken veins very prominent about ankles and medial foot.  Pulmonary/Chest: Effort normal  and breath sounds normal. Right breast exhibits no inverted nipple, no mass, no nipple discharge, no skin change and no tenderness. Left breast exhibits no inverted nipple, no mass, no nipple discharge, no skin change and no tenderness.  Abdominal: Soft. Bowel sounds are normal. She exhibits no mass. There is no hepatosplenomegaly. There is no tenderness. No hernia.  Genitourinary: Uterus normal.  Genitourinary Comments: Normal external female genitalia.  No vaginal discharge. No uterine or adnexal mass or tenderness.  Musculoskeletal: Normal range of motion.  Lymphadenopathy:       Head (right side): No submental and no submandibular adenopathy present.       Head (left side): No submental and no submandibular adenopathy present.    She has no cervical adenopathy.    She has no axillary adenopathy.       Right: No inguinal and no supraclavicular adenopathy present.       Left: No inguinal and no supraclavicular adenopathy present.  Neurological: She is alert and oriented to person, place, and time. She has normal strength and normal reflexes. She displays normal reflexes. No cranial nerve deficit or sensory deficit. Coordination and gait normal.  Skin: Skin is warm. No lesion and no rash noted.  Psychiatric: She has a normal mood and affect. Her speech is normal and behavior is normal. Judgment and thought content normal. Cognition and memory are normal.          Assessment & Plan:  1.  CPE without pap Return for fasting labs:  CBC, CMP, FLP, Vitamin D-OH Recommend flu vaccine in the fall.  2.  History of low Vitamin D level.  Encouraged 3-4 servings of dairy daily and regular weight bearing exercise.  3.  Varicosities of bilateral LE:  Thigh high compression stockings and elevation of legs when home  4.  Food access:  Discussed using EBT card and orange card for more produce/meat at [employer]. Also, with husband not in the country, to call and speak with Karie Fetch, MSW intern as needed for support.

## 2017-01-09 ENCOUNTER — Other Ambulatory Visit (INDEPENDENT_AMBULATORY_CARE_PROVIDER_SITE_OTHER): Payer: Self-pay

## 2017-01-09 DIAGNOSIS — Z13220 Encounter for screening for lipoid disorders: Secondary | ICD-10-CM

## 2017-01-09 DIAGNOSIS — E559 Vitamin D deficiency, unspecified: Secondary | ICD-10-CM

## 2017-01-09 DIAGNOSIS — R7989 Other specified abnormal findings of blood chemistry: Secondary | ICD-10-CM

## 2017-01-09 DIAGNOSIS — Z0000 Encounter for general adult medical examination without abnormal findings: Secondary | ICD-10-CM

## 2017-01-10 LAB — CBC WITH DIFFERENTIAL/PLATELET
BASOS: 0 %
Basophils Absolute: 0 10*3/uL (ref 0.0–0.2)
EOS (ABSOLUTE): 0.1 10*3/uL (ref 0.0–0.4)
EOS: 2 %
HEMATOCRIT: 40 % (ref 34.0–46.6)
HEMOGLOBIN: 12.8 g/dL (ref 11.1–15.9)
Immature Grans (Abs): 0 10*3/uL (ref 0.0–0.1)
Immature Granulocytes: 0 %
Lymphocytes Absolute: 1.7 10*3/uL (ref 0.7–3.1)
Lymphs: 42 %
MCH: 29.3 pg (ref 26.6–33.0)
MCHC: 32 g/dL (ref 31.5–35.7)
MCV: 92 fL (ref 79–97)
MONOCYTES: 7 %
MONOS ABS: 0.3 10*3/uL (ref 0.1–0.9)
Neutrophils Absolute: 2 10*3/uL (ref 1.4–7.0)
Neutrophils: 49 %
Platelets: 284 10*3/uL (ref 150–379)
RBC: 4.37 x10E6/uL (ref 3.77–5.28)
RDW: 14.1 % (ref 12.3–15.4)
WBC: 4.2 10*3/uL (ref 3.4–10.8)

## 2017-01-10 LAB — LIPID PANEL W/O CHOL/HDL RATIO
CHOLESTEROL TOTAL: 134 mg/dL (ref 100–199)
HDL: 43 mg/dL (ref >39)
LDL Calculated: 81 mg/dL (ref 0–99)
TRIGLYCERIDES: 52 mg/dL (ref 0–149)
VLDL Cholesterol Cal: 10 mg/dL (ref 5–40)

## 2017-01-10 LAB — COMPREHENSIVE METABOLIC PANEL
A/G RATIO: 1.7 (ref 1.2–2.2)
ALBUMIN: 4.5 g/dL (ref 3.5–5.5)
ALK PHOS: 69 IU/L (ref 39–117)
ALT: 9 IU/L (ref 0–32)
AST: 14 IU/L (ref 0–40)
BUN / CREAT RATIO: 17 (ref 9–23)
BUN: 11 mg/dL (ref 6–20)
Bilirubin Total: 0.3 mg/dL (ref 0.0–1.2)
CALCIUM: 8.7 mg/dL (ref 8.7–10.2)
CO2: 22 mmol/L (ref 20–29)
CREATININE: 0.66 mg/dL (ref 0.57–1.00)
Chloride: 104 mmol/L (ref 96–106)
GFR calc Af Amer: 134 mL/min/{1.73_m2} (ref >59)
GFR, EST NON AFRICAN AMERICAN: 116 mL/min/{1.73_m2} (ref >59)
GLOBULIN, TOTAL: 2.6 g/dL (ref 1.5–4.5)
Glucose: 82 mg/dL (ref 65–99)
POTASSIUM: 4 mmol/L (ref 3.5–5.2)
SODIUM: 143 mmol/L (ref 134–144)
Total Protein: 7.1 g/dL (ref 6.0–8.5)

## 2017-01-10 LAB — VITAMIN D 25 HYDROXY (VIT D DEFICIENCY, FRACTURES): VIT D 25 HYDROXY: 22.1 ng/mL — AB (ref 30.0–100.0)

## 2017-09-13 ENCOUNTER — Ambulatory Visit: Payer: Self-pay | Admitting: Internal Medicine

## 2017-09-13 ENCOUNTER — Encounter: Payer: Self-pay | Admitting: Internal Medicine

## 2017-09-13 VITALS — BP 122/64 | HR 72 | Resp 12 | Ht 62.0 in | Wt 135.0 lb

## 2017-09-13 DIAGNOSIS — F419 Anxiety disorder, unspecified: Secondary | ICD-10-CM

## 2017-09-13 DIAGNOSIS — F329 Major depressive disorder, single episode, unspecified: Secondary | ICD-10-CM

## 2017-09-13 MED ORDER — FLUOXETINE HCL 20 MG PO TABS: 20.0000 mg | ORAL_TABLET | Freq: Every day | ORAL | 11 refills | Status: DC

## 2017-09-13 NOTE — Progress Notes (Signed)
   Subjective:    Patient ID: Alyssa FellersElisa Singh, female    DOB: 13-Nov-1985, 32 y.o.   MRN: [id number]  HPI   Anxiety:  Fluoxetine prescribed at [facility] about a year ago for anxiety and depression.  Stopped taking about 3 weeks later as she felt better.  States she had never had problems with depression or anxiety prior to last year.  She started feeling anxious and depressed again end of January.  End of February, had discussion with husband as had heard he was having an affair in [place].  She began thinking about this all the time and felt sad.  She found some leftover Fluoxetine 20 mg and started taking again about 1 month ago.  She is feeling much better--not sad and sleeping well.   No suicidal ideation.  Current Meds  Medication Sig  . FLUoxetine (PROZAC) 20 MG tablet Take 20 mg by mouth daily.    Review of Systems     Objective:   Physical Exam NAD Braces! Looks happy smiling Lungs:  CTA CV:  RRR without murmur or rub.         Assessment & Plan:  Depression and anxiety:  Continue Fluoxetine 20 mg daily.  Follow up in 3 months for CPE  Without pap--had one 2 years ago and normal. Will have her also counsel with Samul DadaN. Knight, LCSW

## 2017-09-14 ENCOUNTER — Other Ambulatory Visit: Payer: Self-pay

## 2017-09-14 MED ORDER — FLUOXETINE HCL 20 MG PO CAPS: 20.0000 mg | ORAL_CAPSULE | Freq: Every day | ORAL | 11 refills | Status: AC

## 2017-09-21 ENCOUNTER — Telehealth: Payer: Self-pay | Admitting: Licensed Clinical Social Worker

## 2017-09-21 NOTE — Telephone Encounter (Signed)
LCSW called pt regarding counseling referral from Dr. Delrae AlfredMulberry. Pt reported that she is going on a trip to [place] for the next few months but will call to schedule once she gets back.

## 2017-12-13 ENCOUNTER — Encounter: Payer: Self-pay | Admitting: Internal Medicine
